# Patient Record
Sex: Male | Born: 1990 | Race: Black or African American | Hispanic: No | Marital: Single | Smoking: Former smoker
Health system: Southern US, Community
[De-identification: ages and names within clinical notes are randomized; demographics above are authoritative.]

## PROBLEM LIST (undated history)

## (undated) DIAGNOSIS — R519 Headache, unspecified: Secondary | ICD-10-CM

## (undated) DIAGNOSIS — R55 Syncope and collapse: Secondary | ICD-10-CM

## (undated) DIAGNOSIS — M25519 Pain in unspecified shoulder: Secondary | ICD-10-CM

## (undated) HISTORY — PX: KNEE ARTHROSCOPY WITH ANTERIOR CRUCIATE LIGAMENT (ACL) REPAIR: SHX5644

## (undated) HISTORY — DX: Headache, unspecified: R51.9

## (undated) HISTORY — DX: Pain in unspecified shoulder: M25.519

## (undated) HISTORY — DX: Syncope and collapse: R55

---
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
## 2008-04-13 ENCOUNTER — Emergency Department (HOSPITAL_COMMUNITY): Admission: EM | Admit: 2008-04-13 | Discharge: 2008-04-14 | Payer: Self-pay | Admitting: Emergency Medicine

## 2013-08-23 ENCOUNTER — Ambulatory Visit (INDEPENDENT_AMBULATORY_CARE_PROVIDER_SITE_OTHER): Payer: BC Managed Care – PPO | Admitting: Physician Assistant

## 2013-08-23 VITALS — BP 118/68 | HR 64 | Temp 98.0°F | Resp 16 | Ht 71.5 in | Wt 159.9 lb

## 2013-08-23 DIAGNOSIS — Z23 Encounter for immunization: Secondary | ICD-10-CM

## 2013-08-23 NOTE — Progress Notes (Signed)
   Subjective:    Patient ID: James Maddox, male    DOB: 1990/03/07, 23 y.o.   MRN: [id number]   PCP: No PCP Per Patient  Chief Complaint  Patient presents with  . Immunizations    t dap   Medications, allergies, past medical history, surgical history, family history, social history and problem list reviewed and updated.   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  Review of Systems     Objective:   Physical Exam  BP 118/68  Pulse 64  Temp(Src) 98 F (36.7 C) (Oral)  Resp 16  Ht 5' 11.5" (1.816 m)  Wt 159 lb 14.4 oz (72.53 kg)  BMI 21.99 kg/m2  SpO2 100% WDWNBM, A&O x 3.      Assessment & Plan:  1. Need for Tdap vaccination - Tdap vaccine greater than or equal to 7yo IM   Fernande Brashelle S. Christine Morton, PA-C Physician Assistant-Certified [facility]

## 2014-03-03 ENCOUNTER — Emergency Department (HOSPITAL_COMMUNITY)
Admission: EM | Admit: 2014-03-03 | Discharge: 2014-03-03 | Disposition: A | Discharge status: Home / Self Care | Payer: Self-pay | Attending: Emergency Medicine | Admitting: Emergency Medicine

## 2014-03-03 ENCOUNTER — Encounter (HOSPITAL_COMMUNITY): Payer: Self-pay | Admitting: Physical Medicine and Rehabilitation

## 2014-03-03 DIAGNOSIS — R519 Headache, unspecified: Secondary | ICD-10-CM

## 2014-03-03 DIAGNOSIS — R51 Headache: Secondary | ICD-10-CM | POA: Insufficient documentation

## 2014-03-03 DIAGNOSIS — R197 Diarrhea, unspecified: Secondary | ICD-10-CM | POA: Insufficient documentation

## 2014-03-03 DIAGNOSIS — R112 Nausea with vomiting, unspecified: Secondary | ICD-10-CM | POA: Insufficient documentation

## 2014-03-03 DIAGNOSIS — J029 Acute pharyngitis, unspecified: Secondary | ICD-10-CM | POA: Insufficient documentation

## 2014-03-03 MED ORDER — ONDANSETRON 4 MG PO TBDP: 4.0000 mg | ORAL_TABLET | Freq: Three times a day (TID) | ORAL | Status: AC | PRN

## 2014-03-03 MED ORDER — PSEUDOEPHEDRINE HCL 30 MG PO TABS: 30.0000 mg | ORAL_TABLET | ORAL | Status: AC | PRN

## 2014-03-03 MED ORDER — METOCLOPRAMIDE HCL 5 MG/ML IJ SOLN
10.0000 mg | Freq: Once | INTRAMUSCULAR | Status: AC
  Administered 2014-03-03: 10 mg via INTRAMUSCULAR
  Filled 2014-03-03: qty 2

## 2014-03-03 NOTE — ED Notes (Signed)
Pt presents to department for evaluation of headache, nausea/vomiting and blurred vision. Ongoing x3 days. No relief with medications at home. Pt is alert and oriented x4. No neurological deficits noted.

## 2014-03-03 NOTE — ED Notes (Signed)
Pt A&OX4, ambulatory at d/c with steady gait, NAD 

## 2014-03-03 NOTE — ED Provider Notes (Signed)
Patient presented to the ER with headache, nausea, vomiting, diarrhea.  Face to face Exam: HEENT - PERRLA Lungs - CTAB Heart - RRR, no M/R/G Abd - S/NT/ND Neuro - alert, oriented x3  Plan: Patient with benign exam. Vital signs stable. Symptoms most consistent with viral syndrome. Examination does not support concern or diagnosis of meningitis. Treat symptomatically, return if needed.   Gilda Creasehristopher J. Tyqwan Pink, MD 03/03/14 2012

## 2014-03-03 NOTE — ED Provider Notes (Signed)
CSN: [id number]     Arrival date & time 03/03/14  1814 History   First MD Initiated Contact with Patient 03/03/14 1917     Chief Complaint  Patient presents with  . Headache  . Emesis     (Consider location/radiation/quality/duration/timing/severity/associated sxs/prior Treatment) Patient is a 24 y.o. male presenting with vomiting. The history is provided by the patient. No language interpreter was used.  Emesis Severity:  Moderate Duration:  1 day Timing:  Intermittent Able to tolerate:  Liquids Chronicity:  New Recent urination:  Normal Relieved by:  Nothing Worsened by:  Nothing tried Ineffective treatments:  None tried Associated symptoms: chills, headaches and sore throat   Associated symptoms: no abdominal pain, no cough, no diarrhea and no fever   Headaches:    Severity:  Moderate   Onset quality:  Gradual   Duration:  3 days   Timing:  Constant   Progression:  Worsening   Chronicity:  New Sore throat:    Severity:  Moderate   Onset quality:  Gradual   Duration:  1 day   Timing:  Constant   Progression:  Unchanged Risk factors: no alcohol use, no diabetes and no sick contacts     History reviewed. No pertinent past medical history. Past Surgical History  Procedure Laterality Date  . Knee arthroscopy with anterior cruciate ligament (acl) repair Right    History reviewed. No pertinent family history. History  Substance Use Topics  . Smoking status: Never Smoker   . Smokeless tobacco: Never Used  . Alcohol Use: No    Review of Systems  Constitutional: Positive for chills.  HENT: Positive for sore throat.   Respiratory: Negative for cough and shortness of breath.   Gastrointestinal: Positive for vomiting. Negative for abdominal pain and diarrhea.  Genitourinary: Negative for dysuria, urgency and frequency.  Neurological: Positive for headaches.  All other systems reviewed and are negative.     Allergies  Review of patient's allergies indicates no  known allergies.  Home Medications   Prior to Admission medications   Medication Sig Start Date End Date Taking? Authorizing Provider  ondansetron (ZOFRAN ODT) 4 MG disintegrating tablet Take 1 tablet (4 mg total) by mouth every 8 (eight) hours as needed for nausea or vomiting. 03/03/14   Bethann BerkshireAaron Elica Almas, MD  pseudoephedrine (SUDAFED) 30 MG tablet Take 1 tablet (30 mg total) by mouth every 4 (four) hours as needed for congestion. 03/03/14   Bethann BerkshireAaron Tyffany Waldrop, MD   BP 113/69 mmHg  Pulse 92  Temp(Src) 99.9 F (37.7 C) (Oral)  Resp 20  SpO2 100% Physical Exam  Constitutional: He is oriented to person, place, and time. He appears well-developed and well-nourished. No distress.  HENT:  Head: Normocephalic and atraumatic.  Mouth/Throat: No oropharyngeal exudate, posterior oropharyngeal edema, posterior oropharyngeal erythema or tonsillar abscesses.  Eyes: Pupils are equal, round, and reactive to light.  Neck: Normal range of motion.  Cardiovascular: Normal rate, regular rhythm and normal heart sounds.   Pulmonary/Chest: Effort normal and breath sounds normal. No respiratory distress. He has no decreased breath sounds. He has no wheezes. He has no rhonchi. He has no rales.  Abdominal: Soft. He exhibits no distension. There is no tenderness. There is no rebound and no guarding.  Musculoskeletal: He exhibits no edema or tenderness.  Neurological: He is alert and oriented to person, place, and time. No cranial nerve deficit or sensory deficit. He exhibits normal muscle tone. Coordination and gait normal.  Strength 5/5 bilateral upper and lower extremities.  Sensation intact x4 extremities.  CN II-XII intact.    Skin: Skin is warm and dry.  Nursing note and vitals reviewed.   ED Course  Procedures (including critical care time) Labs Review Labs Reviewed - No data to display  Imaging Review No results found.   EKG Interpretation None      MDM   Final diagnoses:  Nonintractable headache,  unspecified chronicity pattern, unspecified headache type  Non-intractable vomiting with nausea, vomiting of unspecified type  Diarrhea   Patient is a 24 year old African-American male with no pertinent past medical history who comes to the emergency department today with headache, myalgias, nausea, vomiting, diarrhea, and sore throat for the past 3 days. Physical exam as above. Patient has no medical problems. He has a benign abdominal exam as a result doubt appendicitis, acute cholecystitis, or pancreatitis.  Patient has no focal neurologic deficits as a s result I doubt a CVA. He has no neck stiffness and no change in mental status as result I doubt a meningitis. With slow onset of headache I doubt a subarachnoid hemorrhage. Patient is likely suffering from a viral-type syndrome. He was treated with Reglan for his headache and nausea with moderate improvement. He was provided with a prescription for Zofran for his nausea and vomiting and with Sudafed for his nasal congestion. Patient was instructed to return to the emergency department with fevers, chills, worsening pain, or any other concerns. Patient does report some difficulty swallowing however states this is more secondary to pain than actual difficulty. He has no evidence of peritonsillar abscess and has good range of motion of his neck as a result doubt a retropharyngeal abscess.  Patient was discharged in good condition.  Care was discussed with my attending Dr. Blinda Leatherwood.      Bethann Berkshire, MD 03/04/14 0454  Gilda Crease, MD 03/06/14 [phone]

## 2014-03-03 NOTE — Discharge Instructions (Signed)
General Headache Without Cause °A general headache is pain or discomfort felt around the head or neck area. The cause may not be found.  °HOME CARE  °· Keep all doctor visits. °· Only take medicines as told by your doctor. °· Lie down in a dark, quiet room when you have a headache. °· Keep a journal to find out if certain things bring on headaches. For example, write down: °¨ What you eat and drink. °¨ How much sleep you get. °¨ Any change to your diet or medicines. °· Relax by getting a massage or doing other relaxing activities. °· Put ice or heat packs on the head and neck area as told by your doctor. °· Lessen stress. °· Sit up straight. Do not tighten (tense) your muscles. °· Quit smoking if you smoke. °· Lessen how much alcohol you drink. °· Lessen how much caffeine you drink, or stop drinking caffeine. °· Eat and sleep on a regular schedule. °· Get 7 to 9 hours of sleep, or as told by your doctor. °· Keep lights dim if bright lights bother you or make your headaches worse. °GET HELP RIGHT AWAY IF:  °· Your headache becomes really bad. °· You have a fever. °· You have a stiff neck. °· You have trouble seeing. °· Your muscles are weak, or you lose muscle control. °· You lose your balance or have trouble walking. °· You feel like you will pass out (faint), or you pass out. °· You have really bad symptoms that are different than your first symptoms. °· You have problems with the medicines given to you by your doctor. °· Your medicines do not work. °· Your headache feels different than the other headaches. °· You feel sick to your stomach (nauseous) or throw up (vomit). °MAKE SURE YOU:  °· Understand these instructions. °· Will watch your condition. °· Will get help right away if you are not doing well or get worse. °Document Released: 10/22/2007 Document Revised: 04/06/2011 Document Reviewed: 01/02/2011 °ExitCare® Patient Information ©2015 [employer]. This information is not intended to replace advice given to  you by your health care provider. Make sure you discuss any questions you have with your health care provider. ° °Emergency Department Resource Guide °1) Find a Doctor and Pay Out of Pocket °Although you won't have to find out who is covered by your insurance plan, it is a good idea to ask around and get recommendations. You will then need to call the office and see if the doctor you have chosen will accept you as a new patient and what types of options they offer for patients who are self-pay. Some doctors offer discounts or will set up payment plans for their patients who do not have insurance, but you will need to ask so you aren't surprised when you get to your appointment. ° °2) Contact Your Local Health Department °Not all health departments have doctors that can see patients for sick visits, but many do, so it is worth a call to see if yours does. If you don't know where your local health department is, you can check in your phone book. The CDC also has a tool to help you locate your state's health department, and many state websites also have listings of all of their local health departments. ° °3) Find a Walk-in Clinic °If your illness is not likely to be very severe or complicated, you may want to try a walk in clinic. These are popping up all over the   country in pharmacies, drugstores, and shopping centers. They're usually staffed by nurse practitioners or physician assistants that have been trained to treat common illnesses and complaints. They're usually fairly quick and inexpensive. However, if you have serious medical issues or chronic medical problems, these are probably not your best option. ° °No Primary Care Doctor: °- Call Health Connect at  832-8000 - they can help you locate a primary care doctor that  accepts your insurance, provides certain services, etc. °- Physician Referral Service- 1-800-533-3463 ° °Chronic Pain Problems: °Organization         Address  Phone   Notes  °[facility]  (336) 297-2271 Patients need to be referred by their primary care doctor.  ° °Medication Assistance: °Organization         Address  Phone   Notes  °[place] [facility] [address] °[place], [place] [address] (336) 641-8030 --Must be a resident of [place] °-- Must have NO insurance coverage whatsoever (no Medicaid/ Medicare, etc.) °-- The pt. MUST have a primary care doctor that directs their care regularly and follows them in the community °  °[facility]  (866) 331-1348   °[employer]  (888) 892-1162   ° °Agencies that provide inexpensive medical care: °Organization         Address  Phone   Notes  °[facility]  (336) 832-8035   °[facility]    (336) 832-7272   °[facility] [address] °[place], [place] [address] (336) 832-4777   °[facility] [address], °[place] (336) 271-4999   °[facility]    (336) 373-0678   °[facility]    (336) 272-1050   °[facility] ° [address], [place] Phone:  (336) 832-4444, Fax:  (336) 832-4440 Hours of Operation:  9 am - 6 pm, M-F.  Also accepts Medicaid/Medicare and self-pay.  °[facility] ° [address], [place] Phone: (336) 832-3150, Fax: (336) 832-3151. Hours of Operation:  8:30 am - 5:30 pm, M-F.  Also accepts Medicaid and self-pay.  °[facility] [place] [address], [place] Phone: (336) 878-6027   °[facility] [address], [place], [place] (336)723-1848, Ext. 123 Mondays & Thursdays: 7-9 AM.  First 15 patients are seen on a first come, first serve basis. °  ° °Medicaid-accepting [place] Providers: ° °Organization         Address  Phone   Notes  °[facility] [address], [place] (336) 641-2100 Also accepts self-pay patients.  °[facility] [address], [place] ° (336) 856-9996   °[facility] [address], [place] (336) 288-8857   °[facility] [address] [place] [address], [place] (336) 299-7000   °Veita Bland [address], [place]  ° (336) 373-1557 Only accepts [place] Access Medicaid patients after they have their name applied to their card.  ° °Self-Pay (no insurance) in [place]: ° °Organization         Address  Phone   Notes  °Sickle Cell Patients, [facility] [address], [place] (336) 832-1970   °[facility] [address], [place] (336) 832-4400   °[facility] [place] ° [address]   [address], [place] (336) 992-4800   °[facility]/Dr. Osei-Bonsu ° [address] [place] [address], [place] or [address], [place] (336) 841-8500 Phone number for both [place] and [place] locations is the same.  °[facility] [address], [place] (336) 299-0000   °[facility] [address] [place] [address], [place] or [address] (336) 852-7530 °(336) 878-2260   °[facility] [address], [place] (336) 350-1642, phone; (336) 294-5005, fax Sees patients 1st and 3rd Saturday of every month.  Must not qualify for public or private insurance (i.e. Medicaid, Medicare, [place] Health Choice, Veterans' Benefits) • Household income should be no more than 200% of the poverty level •The clinic cannot treat you if you are pregnant or think you are pregnant • Sexually transmitted diseases are not treated at the clinic.  ° ° °Dental Care: °Organization         Address  Phone  Notes  °[facility] [facility] [address], [place] (336) 641-6152 Accepts children up to age 21 who are enrolled in Medicaid or [place] Health Choice; pregnant women with a Medicaid card; and children who have applied for Medicaid or [employer]  Health Choice, but were declined, whose parents can pay a reduced fee at time of service.  °[facility] [place]  [address], [place] (336) 641-7733 Accepts children up to age 21 who are enrolled in Medicaid or [place] Health Choice; pregnant women with a Medicaid card; and children who have applied for Medicaid or [place] Health Choice, but were declined, whose parents can pay a reduced fee at time of service.  °[facility] ° [address], [place] (336) 641-4533 Patients are seen by appointment only. Walk-ins are not accepted. [facility] will see patients 18 years of age and older. °Monday - Tuesday (8am-5pm) °Most Wednesdays (8:30-5pm) °$30 per visit, cash only  °[facility] ° [address], [place] (336) 641-4533 Patients are seen by appointment only. Walk-ins are not accepted. [facility] will see patients 18 years of age and older. °One Wednesday Evening (Monthly: Volunteer Based).  $30 per visit, cash only  °[facility]  (919) 537-3737 for adults; Children under age 4, call Graduate Pediatric Dentistry at (919) [phone]. Children aged 4-14, please call (919) [phone] to request a pediatric application. ° Dental services are provided in all areas of dental care including fillings, crowns and bridges, complete and partial dentures, implants, gum treatment, root canals, and extractions. Preventive care is also provided. Treatment is provided to both adults and children. °Patients are selected via a lottery and there is often a waiting list. °  °[facility] [address], °[place] ° (336) 763-8833 [email or web address] °  °[facility] [address], [place], [place] (336)723-1848, Ext. 123 Second and Fourth Thursday of each month, opens at 6:30 AM; Clinic ends at 9 AM.  Patients are seen on a first-come first-served basis, and a limited number are seen during  each clinic.  ° °[facility] ° [address], [place],  (336) 723-7904   Eligibility Requirements °You must have lived in [place], [place], or [place] counties for at least the last three months. °  You cannot be eligible for state or federal sponsored healthcare insurance, including Veterans Administration, Medicaid, or Medicare. °  You   generally cannot be eligible for healthcare insurance through your employer.  °  How to apply: °Eligibility screenings are held every Tuesday and Wednesday afternoon from 1:00 pm until 4:00 pm. You do not need an appointment for the interview!  °[facility] [address], [place], [place] 336-631-2330   °[facility]  336-342-8273   °[facility]  336-703-3100   °[facility]  336-570-6415   ° °Behavioral Health Resources in the Community: °Intensive Outpatient Programs °Organization         Address  Phone  Notes  °[facility] [address], [place], [place] 336-878-6098   °[facility] [address], [place], [place] 336-832-9800   °[facility] [address], [place], [place] ° 336-882-2125   °[facility] [address],  °[place], [place] 1-800-853-5163 or 336-641-4981   °Substance Abuse Resources °Organization         Address  Phone  Notes  °[facility]  336-882-2125   °[facility]  336-784-9470   °[facility]  336-285-9073   °[facility]  336-845-3988   °Residential & Outpatient Substance Abuse Program  1-800-659-3381   °Psychological Services °Organization         Address  Phone  Notes  °[facility]  336- 832-9600   °[facility]  336- 378-7881   °[facility] [address], [place] 1-800-853-5163 or 336-641-4981   ° °Mobile Crisis Teams °Organization         Address  Phone  Notes  °[facility], Mobile  Crisis Care Unit  1-877-626-1772   °Assertive °[facility] ° [address] [place], [place] 336-834-9664   °Sharon DeEsch [address] °[place] 336-554-5454   ° °Self-Help/Support Groups °Organization         Address  Phone             Notes  °[facility] - variety of support groups  336- 373-1402 Call for more information  °Narcotics Anonymous (NA), [facility] [address], °[place]  2 meetings at this location  ° °Residential Treatment Programs °Organization         Address  Phone  Notes  °[facility] [address],    °[place]  1-866-801-8205   °[facility] ° [address], [place], [place] 704-293-8524   °[facility] [address], [place] 336-845-3988 Admissions: 8am-3pm M-F  °[facility] [address]    °[place], [place] 336-841-1104   °[facility] [address], [place], [place] 336-379-7146   °[facility] [address]  °[place], [place] 336-285-9073   °[facility] - Intensive Outpatient [address], [place], [place] 336-852-3033   °[facility] [address]  °[place], [place] 1-877-615-2722 or 336-784-9470   °[facility] [address] [place], [place] 336-227-7417 Accepts Medicaid  °[facility] [address]  °[place]  1-800-659-3381 Substance Abuse/Addiction Treatment  ° °[place] Behavioral Health Resources °Organization         Address  Phone  Notes  °[facility]  (888) 581-9988   °Julie Brannon, PhD [address] [place],    (336) 349-5553 or (336) 951-0000   °[facility]   [address] °[place], [place] (336) 349-4454   °[facility] [address], [place], [place] (336) 342-8316 Insurance/Medicaid/sponsorship through [facility]  °[facility] [address]                                    [place], [place] (336) 342-8316 Therapy/tele-psych/case  °[facility] [address]  ° [place], [place] (336) 349-2233    °Dr. Arfeen  (336) 349-4544   °[facility] [place]  [employer] [place] [facility] 1) [address],  °2) [address], [place] °3)  [address], [place] (336) 349-3220 °(336) 342-7768 ° °(336) 342-8140   °[place] Child Abuse [facility] (336) 342-1394 or (336) 342-3537 (After Hours)    ° ° °

## 2020-06-27 ENCOUNTER — Other Ambulatory Visit: Payer: Self-pay | Admitting: Family Medicine

## 2020-06-27 ENCOUNTER — Other Ambulatory Visit: Payer: Self-pay

## 2020-06-27 ENCOUNTER — Ambulatory Visit: Payer: Self-pay

## 2020-06-27 DIAGNOSIS — M25561 Pain in right knee: Secondary | ICD-10-CM

## 2022-02-28 ENCOUNTER — Emergency Department (HOSPITAL_COMMUNITY)
Admission: EM | Admit: 2022-02-28 | Discharge: 2022-02-28 | Disposition: A | Discharge status: Home / Self Care | Payer: BC Managed Care – PPO | Attending: Emergency Medicine | Admitting: Emergency Medicine

## 2022-02-28 ENCOUNTER — Other Ambulatory Visit: Payer: Self-pay

## 2022-02-28 ENCOUNTER — Encounter (HOSPITAL_COMMUNITY): Payer: Self-pay | Admitting: Emergency Medicine

## 2022-02-28 DIAGNOSIS — R55 Syncope and collapse: Secondary | ICD-10-CM | POA: Diagnosis present

## 2022-02-28 DIAGNOSIS — R519 Headache, unspecified: Secondary | ICD-10-CM | POA: Diagnosis not present

## 2022-02-28 DIAGNOSIS — M25512 Pain in left shoulder: Secondary | ICD-10-CM | POA: Diagnosis not present

## 2022-02-28 LAB — CBC
HCT: 41.2 % (ref 39.0–52.0)
Hemoglobin: 13.9 g/dL (ref 13.0–17.0)
MCH: 32.9 pg (ref 26.0–34.0)
MCHC: 33.7 g/dL (ref 30.0–36.0)
MCV: 97.4 fL (ref 80.0–100.0)
Platelets: 152 10*3/uL (ref 150–400)
RBC: 4.23 MIL/uL (ref 4.22–5.81)
RDW: 12.7 % (ref 11.5–15.5)
WBC: 5.3 10*3/uL (ref 4.0–10.5)
nRBC: 0 % (ref 0.0–0.2)

## 2022-02-28 LAB — BASIC METABOLIC PANEL
Anion gap: 10 (ref 5–15)
BUN: 12 mg/dL (ref 6–20)
CO2: 25 mmol/L (ref 22–32)
Calcium: 9.3 mg/dL (ref 8.9–10.3)
Chloride: 100 mmol/L (ref 98–111)
Creatinine, Ser: 1.07 mg/dL (ref 0.61–1.24)
GFR, Estimated: >60 mL/min (ref >60)
Glucose, Bld: 121 mg/dL — ABNORMAL HIGH (ref 70–99)
Potassium: 4.2 mmol/L (ref 3.5–5.1)
Sodium: 135 mmol/L (ref 135–145)

## 2022-02-28 LAB — CBG MONITORING, ED: Glucose-Capillary: 86 mg/dL (ref 70–99)

## 2022-02-28 MED ORDER — SODIUM CHLORIDE 0.9 % IV BOLUS
1000.0000 mL | Freq: Once | INTRAVENOUS | Status: AC
  Administered 2022-02-28: 1000 mL via INTRAVENOUS

## 2022-02-28 NOTE — ED Triage Notes (Signed)
Pt reports he was cooking today and became lightheaded and passed out. Pt stating he hit his left shoulder and left side of his head. Denies neck pain.

## 2022-02-28 NOTE — Discharge Instructions (Addendum)
You were evaluated today for a syncopal episode.  This may be due to a vasovagal response or may be due to dehydration.  Your EKG was unremarkable.  Your lab work was reassuring.  If you experience further syncopal episodes or other life-threatening symptoms please return to the emergency department. If you continue to have pain in the left shoulder I recommend follow-up with orthopedics.  I have provided the contact information from the on-call provider.  The sling you have been placed in is for comfort.  He can find it cumbersome or not helpful you may stop using it.

## 2022-02-28 NOTE — ED Provider Notes (Signed)
[facility] Provider Note   CSN: [id number] Arrival date & time: 02/28/22  1630     History  Chief Complaint  Patient presents with   Loss of Consciousness    James Maddox is a 32 y.o. male.  Patient presents to the emergency department after a syncopal episode.  Patient states he was standing and cooking when he began to feel hot, got lightheaded, and fell to the floor landing on his left shoulder and possibly hitting his head on the ground.  He does endorse a mild headache earlier in the day.  He denies nausea, vomiting, amnesia.  He denies current headache and denies neck pain.  He does state that he may not have been drinking much water over the past few days.  No relevant past medical history on file.  HPI     Home Medications Prior to Admission medications   Medication Sig Start Date End Date Taking? Authorizing Provider  ondansetron (ZOFRAN ODT) 4 MG disintegrating tablet Take 1 tablet (4 mg total) by mouth every 8 (eight) hours as needed for nausea or vomiting. 03/03/14   Katheren Shams, MD  pseudoephedrine (SUDAFED) 30 MG tablet Take 1 tablet (30 mg total) by mouth every 4 (four) hours as needed for congestion. 03/03/14   Katheren Shams, MD      Allergies    Patient has no known allergies.    Review of Systems   Review of Systems  Musculoskeletal:  Positive for arthralgias.  Neurological:  Positive for syncope and headaches.    Physical Exam Updated Vital Signs BP 136/84   Pulse 87   Temp 98.4 F (36.9 C) (Oral)   Resp 16   SpO2 100%  Physical Exam Vitals and nursing note reviewed.  Constitutional:      General: He is not in acute distress.    Appearance: He is well-developed.  HENT:     Head: Normocephalic and atraumatic.  Eyes:     Conjunctiva/sclera: Conjunctivae normal.  Cardiovascular:     Rate and Rhythm: Normal rate and regular rhythm.     Heart sounds: No murmur heard. Pulmonary:     Effort:  Pulmonary effort is normal. No respiratory distress.     Breath sounds: Normal breath sounds.  Abdominal:     Palpations: Abdomen is soft.     Tenderness: There is no abdominal tenderness.  Musculoskeletal:        General: No swelling, tenderness or deformity. Normal range of motion.     Cervical back: Neck supple.     Comments: Patient with normal range of motion of the left shoulder.  Normal sensation.  Brisk cap refill  Skin:    General: Skin is warm and dry.     Capillary Refill: Capillary refill takes less than 2 seconds.  Neurological:     General: No focal deficit present.     Mental Status: He is alert.  Psychiatric:        Mood and Affect: Mood normal.     ED Results / Procedures / Treatments   Labs (all labs ordered are listed, but only abnormal results are displayed) Labs Reviewed  BASIC METABOLIC PANEL - Abnormal; Notable for the following components:      Result Value   Glucose, Bld 121 (*)    All other components within normal limits  CBC  CBG MONITORING, ED    EKG None  Radiology No results found.  Procedures Procedures  Medications Ordered in ED Medications  sodium chloride 0.9 % bolus 1,000 mL (0 mLs Intravenous Stopped 02/28/22 1958)    ED Course/ Medical Decision Making/ A&P                             Medical Decision Making Amount and/or Complexity of Data Reviewed Labs: ordered.   This patient presents to the ED for concern of syncope, this involves an extensive number of treatment options, and is a complaint that carries with it a high risk of complications and morbidity.  The differential diagnosis includes vasovagal syncope, orthostatic changes due to dehydration, dysrhythmia, CVA, others   Co morbidities that complicate the patient evaluation  None   Additional history obtained:  Additional history obtained from visitor at bedside    Lab Tests:  I Ordered, and personally interpreted labs.  The pertinent results include:  Grossly unremarkable CBC, BMP    Cardiac Monitoring: / EKG:  The patient was maintained on a cardiac monitor.  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  EKG was unremarkable.  No dysrhythmia.  No neurodeficits to suggest CVA.  Patient does not clinically appear dehydrated.  He was administered a liter of fluid.  Negative orthostatic changes.  The patient does have some left shoulder pain.  The patient has normal range of motion but does complain of pain.  No deformity noted.  I see no indication at this time for imaging.  Patient was placed in a sling.  I will provide orthopedic follow-up information to be used as needed.  Plan to discharge patient home at this time with recommendations for follow-up as needed.  Return precautions provided        Final Clinical Impression(s) / ED Diagnoses Final diagnoses:  Syncope and collapse  Acute pain of left shoulder    Rx / DC Orders ED Discharge Orders     None         Ronny Bacon 02/28/22 Patrecia Pour    Regan Lemming, MD 03/02/22 1511

## 2022-02-28 NOTE — ED Notes (Signed)
Pt given sandwich, applesauce and water.

## 2022-03-02 IMAGING — DX DG KNEE COMPLETE 4+V*R*
4 series · 4 of 4 positions shown · non-contrast
Comparison: None.

CLINICAL DATA: Pain following twisting injury

EXAM:
RIGHT KNEE - COMPLETE 4+ VIEW

[knee pa]
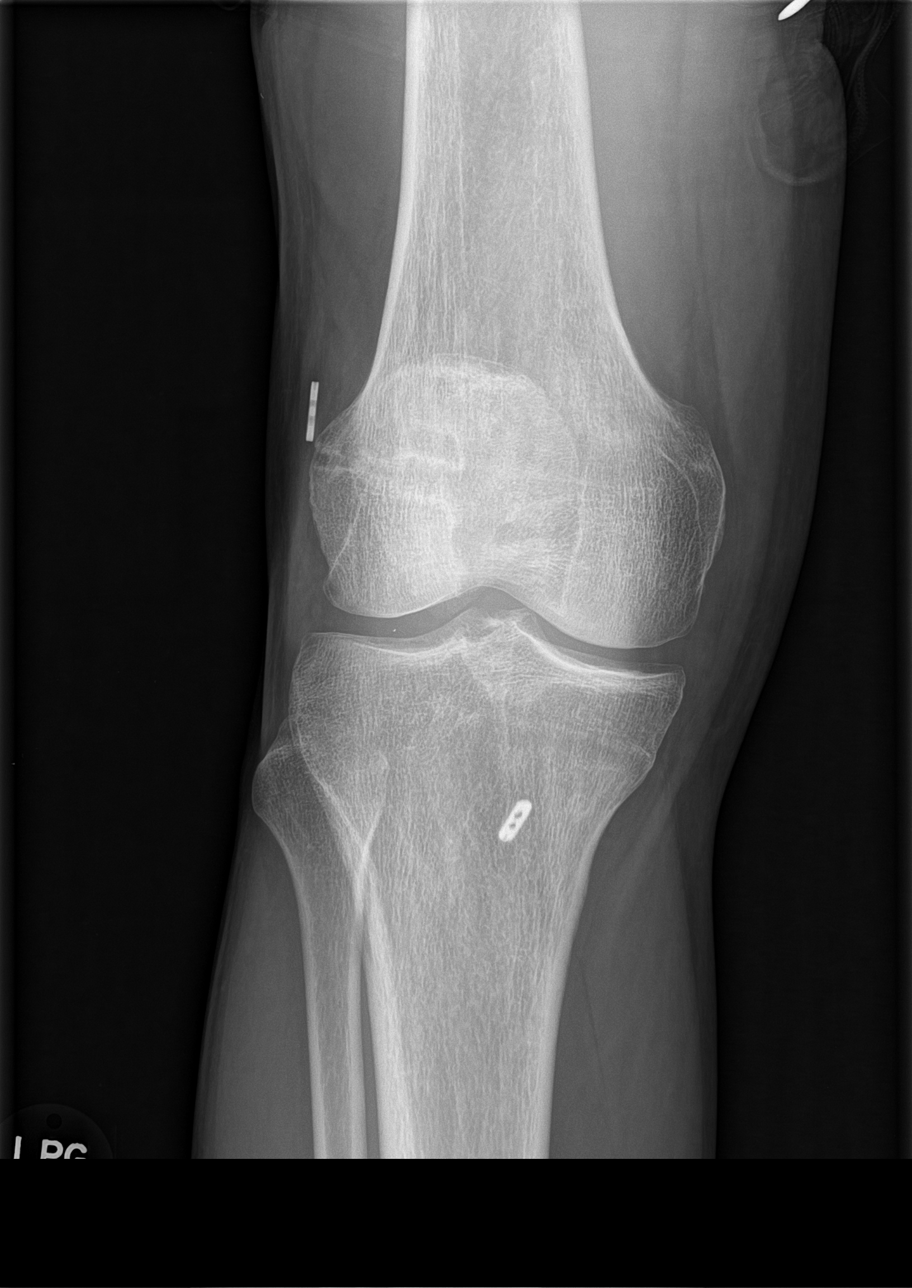

[knee obl (1 of 2)]
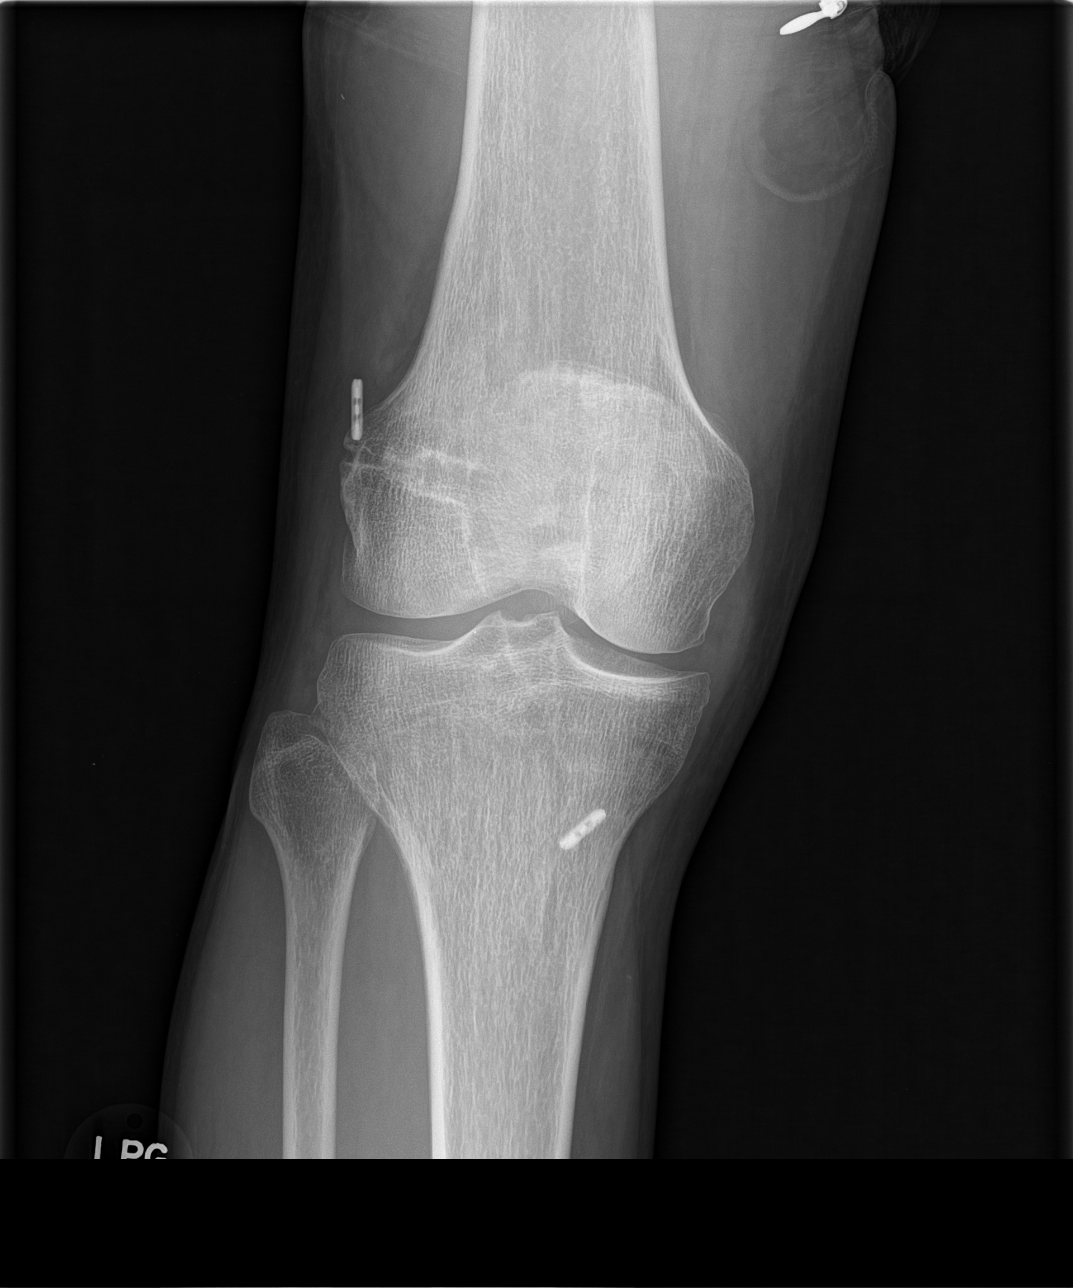

[knee obl (2 of 2)]
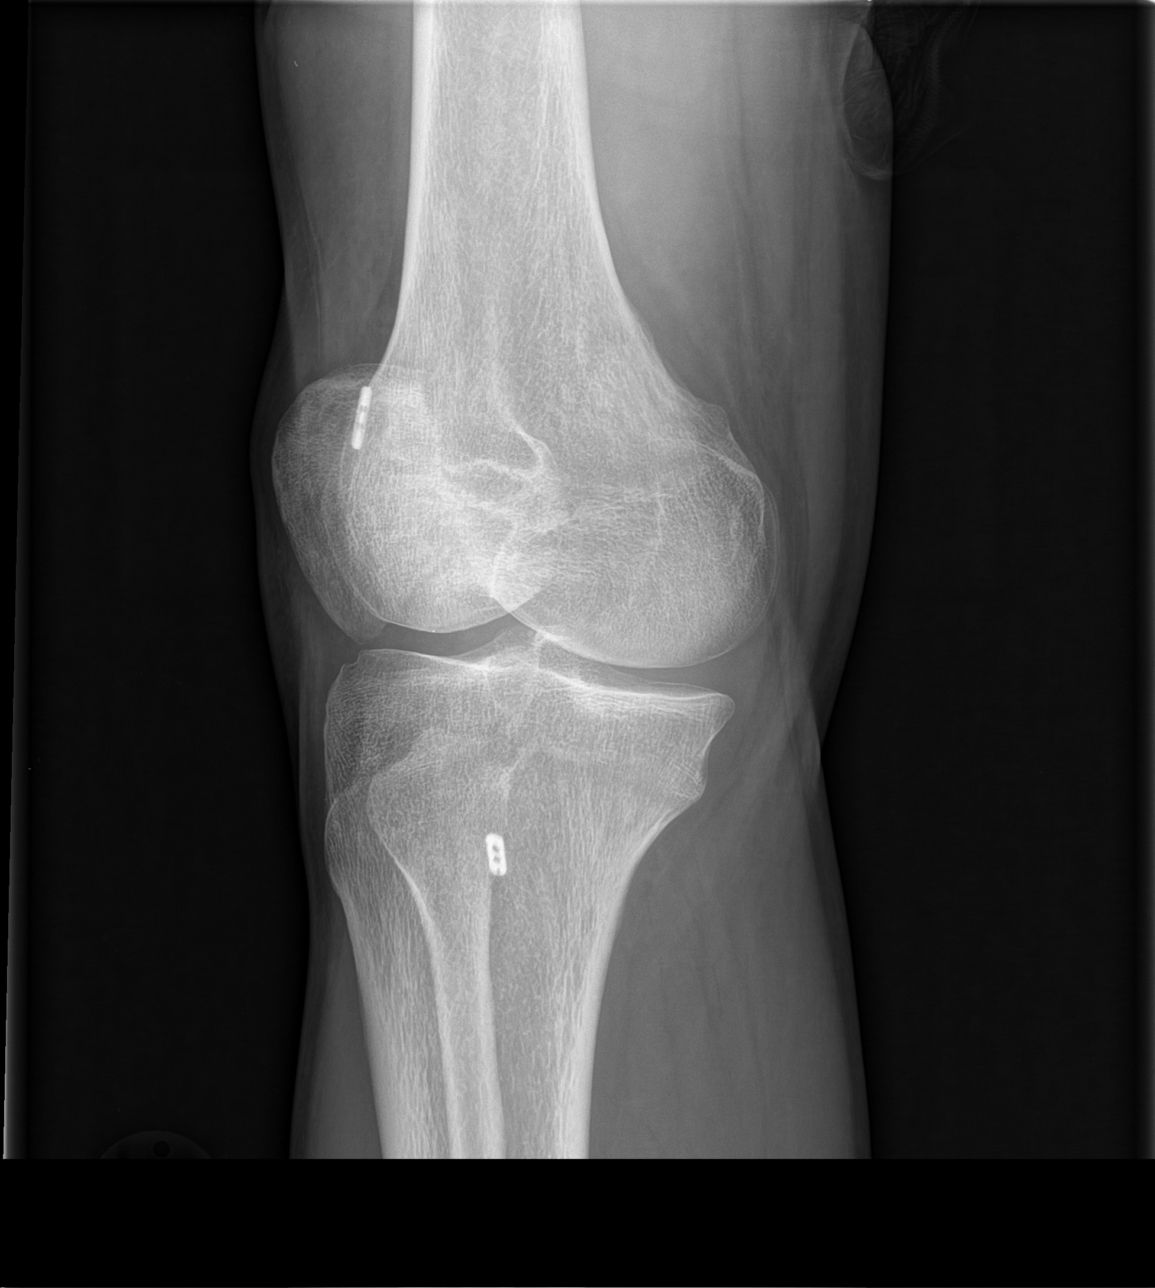

[knee lat]
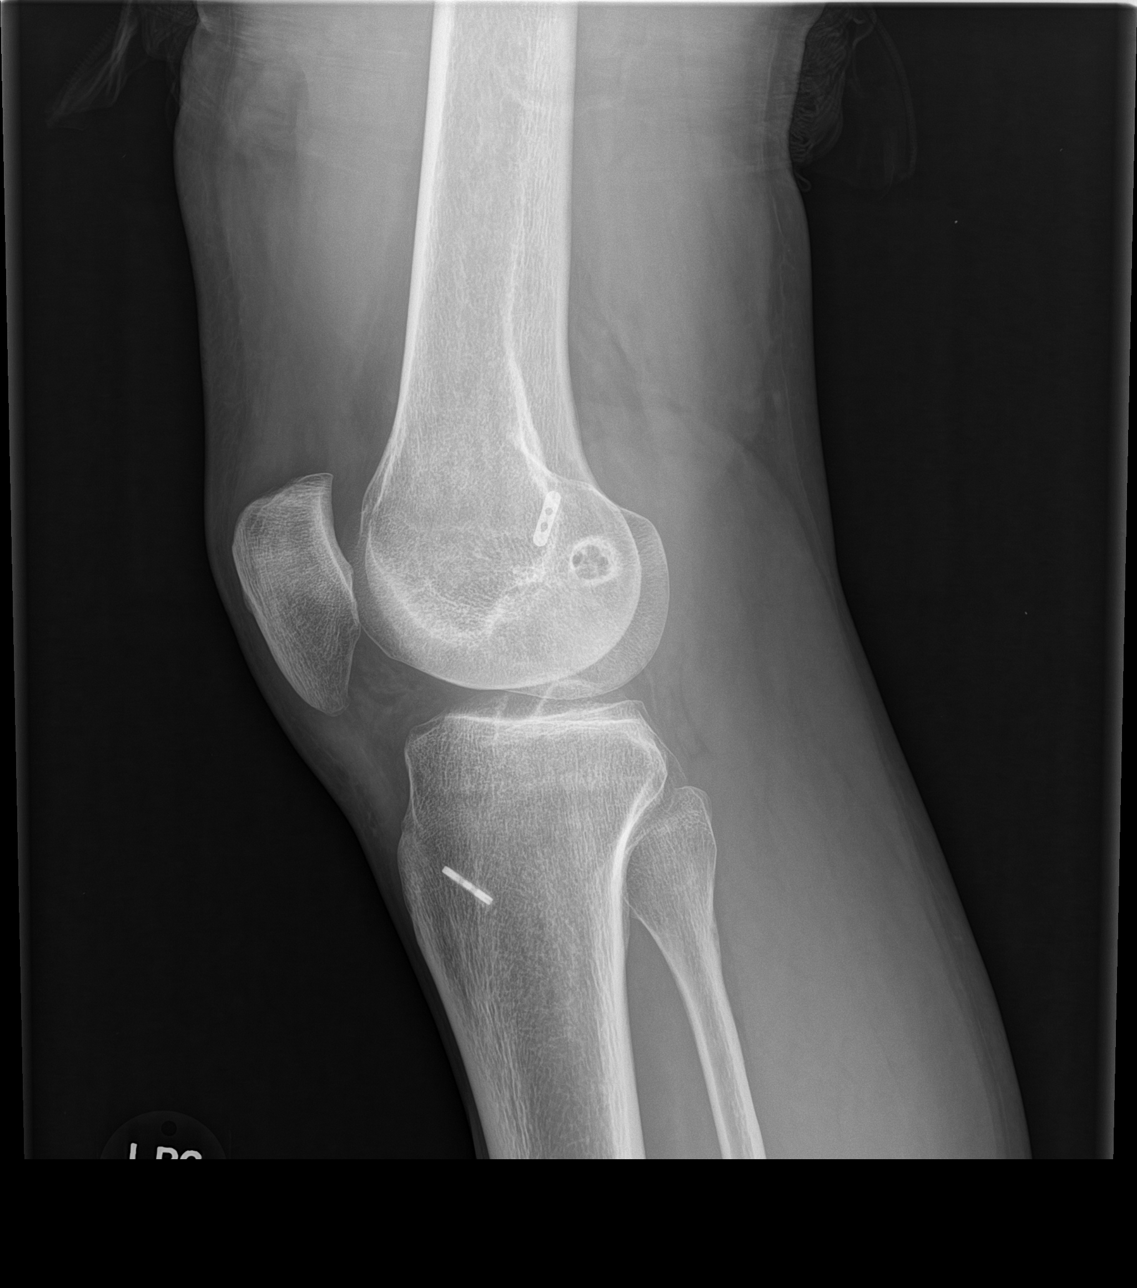

[4 of 4 positions shown; findings below may reference images not displayed]

FINDINGS: Frontal, lateral, and bilateral oblique views were obtained.
Evidence of previous anterior cruciate ligament repair. There is
slight lateral patellar subluxation. No fracture or dislocation. No
appreciable joint space narrowing. No joint effusion. No erosive
change.
IMPRESSION: Status post anterior cruciate ligament repair. Mild lateral patellar
subluxation. No fracture, dislocation, or joint effusion. No evident
arthropathic change.

## 2022-03-03 ENCOUNTER — Other Ambulatory Visit: Payer: Self-pay

## 2022-03-03 ENCOUNTER — Emergency Department (HOSPITAL_COMMUNITY): Payer: BC Managed Care – PPO

## 2022-03-03 ENCOUNTER — Encounter (HOSPITAL_COMMUNITY): Payer: Self-pay | Admitting: Emergency Medicine

## 2022-03-03 ENCOUNTER — Emergency Department (HOSPITAL_COMMUNITY)
Admission: EM | Admit: 2022-03-03 | Discharge: 2022-03-04 | Disposition: A | Discharge status: Home / Self Care | Payer: BC Managed Care – PPO | Attending: Emergency Medicine | Admitting: Emergency Medicine

## 2022-03-03 DIAGNOSIS — W01198A Fall on same level from slipping, tripping and stumbling with subsequent striking against other object, initial encounter: Secondary | ICD-10-CM | POA: Insufficient documentation

## 2022-03-03 DIAGNOSIS — R55 Syncope and collapse: Secondary | ICD-10-CM

## 2022-03-03 DIAGNOSIS — Y92000 Kitchen of unspecified non-institutional (private) residence as  the place of occurrence of the external cause: Secondary | ICD-10-CM | POA: Insufficient documentation

## 2022-03-03 DIAGNOSIS — Y990 Civilian activity done for income or pay: Secondary | ICD-10-CM | POA: Insufficient documentation

## 2022-03-03 DIAGNOSIS — Z1152 Encounter for screening for COVID-19: Secondary | ICD-10-CM | POA: Diagnosis not present

## 2022-03-03 DIAGNOSIS — S0990XD Unspecified injury of head, subsequent encounter: Secondary | ICD-10-CM | POA: Diagnosis present

## 2022-03-03 DIAGNOSIS — S060X1D Concussion with loss of consciousness of 30 minutes or less, subsequent encounter: Secondary | ICD-10-CM | POA: Diagnosis not present

## 2022-03-03 LAB — COMPREHENSIVE METABOLIC PANEL WITH GFR
ALT: 28 U/L (ref 0–44)
AST: 23 U/L (ref 15–41)
Albumin: 4 g/dL (ref 3.5–5.0)
Alkaline Phosphatase: 48 U/L (ref 38–126)
Anion gap: 6 (ref 5–15)
BUN: 15 mg/dL (ref 6–20)
CO2: 28 mmol/L (ref 22–32)
Calcium: 8.8 mg/dL — ABNORMAL LOW (ref 8.9–10.3)
Chloride: 101 mmol/L (ref 98–111)
Creatinine, Ser: 1.17 mg/dL (ref 0.61–1.24)
GFR, Estimated: >60 mL/min
Glucose, Bld: 111 mg/dL — ABNORMAL HIGH (ref 70–99)
Potassium: 3.5 mmol/L (ref 3.5–5.1)
Sodium: 135 mmol/L (ref 135–145)
Total Bilirubin: 0.4 mg/dL (ref 0.3–1.2)
Total Protein: 7.6 g/dL (ref 6.5–8.1)

## 2022-03-03 LAB — CBC
HCT: 41 % (ref 39.0–52.0)
Hemoglobin: 13.4 g/dL (ref 13.0–17.0)
MCH: 32.1 pg (ref 26.0–34.0)
MCHC: 32.7 g/dL (ref 30.0–36.0)
MCV: 98.1 fL (ref 80.0–100.0)
Platelets: 172 10*3/uL (ref 150–400)
RBC: 4.18 MIL/uL — ABNORMAL LOW (ref 4.22–5.81)
RDW: 12.7 % (ref 11.5–15.5)
WBC: 5.3 10*3/uL (ref 4.0–10.5)
nRBC: 0 % (ref 0.0–0.2)

## 2022-03-03 LAB — RESP PANEL BY RT-PCR (RSV, FLU A&B, COVID)  RVPGX2
Influenza A by PCR: NEGATIVE
Influenza B by PCR: NEGATIVE
Resp Syncytial Virus by PCR: NEGATIVE
SARS Coronavirus 2 by RT PCR: NEGATIVE

## 2022-03-03 LAB — URINALYSIS, ROUTINE W REFLEX MICROSCOPIC
Bilirubin Urine: NEGATIVE
Glucose, UA: NEGATIVE mg/dL
Hgb urine dipstick: NEGATIVE
Ketones, ur: NEGATIVE mg/dL
Leukocytes,Ua: NEGATIVE
Nitrite: NEGATIVE
Protein, ur: NEGATIVE mg/dL
Specific Gravity, Urine: 1.028 (ref 1.005–1.030)
pH: 6 (ref 5.0–8.0)

## 2022-03-03 LAB — TROPONIN I (HIGH SENSITIVITY): Troponin I (High Sensitivity): <2 ng/L (ref <18)

## 2022-03-03 NOTE — ED Triage Notes (Signed)
PT complains of feeling over heated, tired, feeling of passing out and chest pressure. Pt states he was here a few days ago after passing out and has feelings of the same today. Pt has headache and shoulder pain from previous injury after passing out.

## 2022-03-03 NOTE — ED Provider Triage Note (Signed)
Emergency Medicine Provider Triage Evaluation Note  James Maddox , a 32 y.o. male  was evaluated in triage.  Pt complains of near syncopal episode today.  Patient reports that he was here on Saturday for the same presentation.  However when he was at school, he felt he was in a pass out but did not pass out.  He has been having some fatigue as well as some left-sided head pain since he fell.  He reports that he hit his head when he fell and has been having pain since.  He is also having some photophobia as well.  He questions if he is having some chest pain or not.  Review of Systems  Positive:  Negative:   Physical Exam  Ht 5\' 10"  (1.778 m)   Wt 76 kg   BMI 24.04 kg/m  Gen:   Awake, no distress   Resp:  Normal effort  MSK:   Moves extremities without difficulty  Other:  Cranial nerves grossly intact.  No pronator drift.  Answers questions appropriate appropriate speech.  No midline tenderness to the cervical spine however does have pain with movement.  Medical Decision Making  Medically screening exam initiated at 5:47 PM.  Appropriate orders placed.  James Maddox was informed that the remainder of the evaluation will be completed by another provider, this initial triage assessment does not replace that evaluation, and the importance of remaining in the ED until their evaluation is complete.  Ct head and C spine ordered with labs.    James Maddox, [place] 03/03/22 1749

## 2022-03-03 NOTE — ED Provider Notes (Signed)
[facility] Provider Note   CSN: [id number] Arrival date & time: 03/03/22  1617     History {Add pertinent medical, surgical, social history, OB history to HPI:1} Chief Complaint  Patient presents with   Fatigue   Near Syncope    James Maddox is a 32 y.o. male.  31 yo male presents with complaint of feeling unwell today. Patient states he was seen here Saturday after a syncopal episode.  Patient states that he was having a usual day Saturday, was sitting when he started to feel lightheaded and sweaty, walked into the kitchen and while at the countertop, he passed out.  Patient states he hit the left side of his head, left shoulder on the way down, his roommate heard him fall.  Reports brief LOC, did not bite tongue or lose bladder control, no history of seizures.  Patient was seen in the ER at that time, told he may have a concussion, was placed in arm sling and discharged.  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  Denies palpitations or chest pain, feeling poorly prompted him to return to the ER for further evaluation.  States that he continues to have intermittent left-sided headaches, worse with computer use without nausea or vomiting.  Denies unilateral weakness or numbness, changes in speech or gait. States that he was having intermittent chest tightness back in October with exertion, was never evaluated for this at that time.  He denies any family heart history.  Denies ever experiencing palpitations.  Reports occasional marijuana use, occasional alcohol use, denies other illicit substances.  Denies consuming any vitamins or supplements.       Home Medications Prior to Admission medications   Medication Sig Start Date End Date Taking? Authorizing Provider  ondansetron (ZOFRAN ODT) 4 MG disintegrating tablet Take 1 tablet (4 mg total) by mouth every 8 (eight) hours  as needed for nausea or vomiting. 03/03/14   Katheren Shams, MD  pseudoephedrine (SUDAFED) 30 MG tablet Take 1 tablet (30 mg total) by mouth every 4 (four) hours as needed for congestion. 03/03/14   Katheren Shams, MD      Allergies    Patient has no known allergies.    Review of Systems   Review of Systems Negative except as per HPI Physical Exam Updated Vital Signs BP 116/78 (BP Location: Left Arm)   Pulse 60   Temp 98.4 F (36.9 C) (Oral)   Resp 16   Ht 5\' 10"  (1.778 m)   Wt 76 kg   SpO2 98%   BMI 24.04 kg/m  Physical Exam Vitals and nursing note reviewed.  Constitutional:      General: He is not in acute distress.    Appearance: He is well-developed. He is not diaphoretic.  HENT:     Head: Normocephalic and atraumatic.     Mouth/Throat:     Mouth: Mucous membranes are moist.  Eyes:     Extraocular Movements: Extraocular movements intact.     Pupils: Pupils are equal, round, and reactive to light.  Cardiovascular:     Rate and Rhythm: Normal rate and regular rhythm.     Heart sounds: Normal heart sounds.  Pulmonary:     Effort: Pulmonary effort is normal.     Breath sounds: Normal breath sounds.  Musculoskeletal:        General: Normal range of motion.  Cervical back: Normal range of motion and neck supple. No tenderness.  Skin:    General: Skin is warm and dry.     Findings: No erythema or rash.  Neurological:     Mental Status: He is alert and oriented to person, place, and time.  Psychiatric:        Behavior: Behavior normal.     ED Results / Procedures / Treatments   Labs (all labs ordered are listed, but only abnormal results are displayed) Labs Reviewed  CBC - Abnormal; Notable for the following components:      Result Value   RBC 4.18 (*)    All other components within normal limits  COMPREHENSIVE METABOLIC PANEL - Abnormal; Notable for the following components:   Glucose, Bld 111 (*)    Calcium 8.8 (*)    All other components within normal  limits  RESP PANEL BY RT-PCR (RSV, FLU A&B, COVID)  RVPGX2  URINALYSIS, ROUTINE W REFLEX MICROSCOPIC  TROPONIN I (HIGH SENSITIVITY)    EKG None  Radiology CT Cervical Spine Wo Contrast  Result Date: 03/03/2022 CLINICAL DATA:  Neck trauma. EXAM: CT CERVICAL SPINE WITHOUT CONTRAST TECHNIQUE: Multidetector CT imaging of the cervical spine was performed without intravenous contrast. Multiplanar CT image reconstructions were also generated. RADIATION DOSE REDUCTION: This exam was performed according to the departmental dose-optimization program which includes automated exposure control, adjustment of the mA and/or kV according to patient size and/or use of iterative reconstruction technique. COMPARISON:  None Available. FINDINGS: Alignment: Normal. Skull base and vertebrae: No acute fracture. No primary bone lesion or focal pathologic process. Soft tissues and spinal canal: No prevertebral fluid or swelling. No visible canal hematoma. Disc levels:  Normal Upper chest: Negative. Other: None. IMPRESSION: No evidence of acute traumatic injury to the cervical spine. Electronically Signed   By: Fidela Salisbury M.D.   On: 03/03/2022 20:06   CT Head Wo Contrast  Result Date: 03/03/2022 CLINICAL DATA:  Head trauma. EXAM: CT HEAD WITHOUT CONTRAST TECHNIQUE: Contiguous axial images were obtained from the base of the skull through the vertex without intravenous contrast. RADIATION DOSE REDUCTION: This exam was performed according to the departmental dose-optimization program which includes automated exposure control, adjustment of the mA and/or kV according to patient size and/or use of iterative reconstruction technique. COMPARISON:  None Available. FINDINGS: Brain: No evidence of acute infarction, hemorrhage, hydrocephalus, extra-axial collection or mass lesion/mass effect. Vascular: No hyperdense vessel or unexpected calcification. Skull: Normal. Negative for fracture or focal lesion. Sinuses/Orbits: No acute  finding. Other: None. IMPRESSION: No acute intracranial pathology. Electronically Signed   By: Fidela Salisbury M.D.   On: 03/03/2022 20:03    Procedures Procedures  {Document cardiac monitor, telemetry assessment procedure when appropriate:1}  Medications Ordered in ED Medications - No data to display  ED Course/ Medical Decision Making/ A&P   {   Click here for ABCD2, HEART and other calculatorsREFRESH Note before signing :1}                          Medical Decision Making Amount and/or Complexity of Data Reviewed Labs: ordered.   ***  {Document critical care time when appropriate:1} {Document review of labs and clinical decision tools ie heart score, Chads2Vasc2 etc:1}  {Document your independent review of radiology images, and any outside records:1} {Document your discussion with family members, caretakers, and with consultants:1} {Document social determinants of health affecting pt's care:1} {Document your decision making why or why  not admission, treatments were needed:1} Final Clinical Impression(s) / ED Diagnoses Final diagnoses:  None    Rx / DC Orders ED Discharge Orders     None

## 2022-03-04 LAB — D-DIMER, QUANTITATIVE: D-Dimer, Quant: <0.27 ug/mL-FEU (ref 0.00–0.50)

## 2022-03-04 NOTE — Discharge Instructions (Addendum)
Provided with referral to cardiology for syncope on Saturday.  The office should contact you to schedule this appointment.  Please return to ER for recurrent or worsening symptoms.  Regarding her headache and concussion symptoms, recommend screening rest including phones, TVs, computers.  Provided with referral to concussion clinic, you will need to call and schedule appointment.

## 2022-03-04 NOTE — ED Notes (Signed)
Patient verbalizes understanding of discharge instructions. Opportunity for questioning and answers were provided. Armband removed by staff, pt discharged from ED. Pt ambulatory to ED waiting room with steady gait.  

## 2022-03-12 ENCOUNTER — Ambulatory Visit (INDEPENDENT_AMBULATORY_CARE_PROVIDER_SITE_OTHER): Payer: BC Managed Care – PPO

## 2022-03-12 ENCOUNTER — Ambulatory Visit (INDEPENDENT_AMBULATORY_CARE_PROVIDER_SITE_OTHER): Payer: BC Managed Care – PPO | Admitting: Orthopaedic Surgery

## 2022-03-12 DIAGNOSIS — M25512 Pain in left shoulder: Secondary | ICD-10-CM | POA: Diagnosis not present

## 2022-03-12 MED ORDER — TRIAMCINOLONE ACETONIDE 40 MG/ML IJ SUSP
80.0000 mg | INTRAMUSCULAR | Status: AC | PRN
  Administered 2022-03-12: 80 mg via INTRA_ARTICULAR

## 2022-03-12 MED ORDER — LIDOCAINE HCL 1 % IJ SOLN
4.0000 mL | INTRAMUSCULAR | Status: AC | PRN
  Administered 2022-03-12: 4 mL

## 2022-03-12 NOTE — Progress Notes (Signed)
Chief Complaint: Left shoulder pain.     History of Present Illness:    James Maddox is a 32 y.o. male right-hand-dominant male presents with left shoulder pain after a fall directly on his side.  He states that he had a fall when he passed out and ultimately suffered a concussion.  He does not remember if left shoulder dislocated or subluxated.  Denies any previous history of left shoulder pain.  He has pain probably about the anterior joint line.  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    Surgical History:   none  PMH/PSH/Family History/Social History/Meds/Allergies:   No past medical history on file. Past Surgical History:  Procedure Laterality Date  . KNEE ARTHROSCOPY WITH ANTERIOR CRUCIATE LIGAMENT (ACL) REPAIR Right    Social History   Socioeconomic History  . Marital status: Single    Spouse name: n/a  . Number of children: 1  . Years of education: Not on file  . Highest education level: Not on file  Occupational History  . Occupation: Lexicographer: [employer]    Comment: [employer]  Tobacco Use  . Smoking status: Former    Types: Cigarettes    Quit date: 01/02/2022    Years since quitting: 0.1  . Smokeless tobacco: Never  Substance and Sexual Activity  . Alcohol use: No  . Drug use: No  . Sexual activity: Not on file  Other Topics Concern  . Not on file  Social History Narrative   Lives with his mother.  His daughter lives with her mother.   Social Determinants of Health   Financial Resource Strain: Not on file  Food Insecurity: Not on file  Transportation Needs: Not on file  Physical Activity: Not on file  Stress: Not on file  Social Connections: Not on file   No family history on file. No Known Allergies Current Outpatient Medications  Medication Sig Dispense Refill  . ondansetron (ZOFRAN ODT) 4 MG disintegrating tablet Take 1 tablet (4 mg total) by mouth  every 8 (eight) hours as needed for nausea or vomiting. 20 tablet 0  . pseudoephedrine (SUDAFED) 30 MG tablet Take 1 tablet (30 mg total) by mouth every 4 (four) hours as needed for congestion. 30 tablet 0   No current facility-administered medications for this visit.   No results found.  Review of Systems:   A ROS was performed including pertinent positives and negatives as documented in the HPI.  Physical Exam :   Constitutional: NAD and appears stated age Neurological: Alert and oriented Psych: Appropriate affect and cooperative There were no vitals taken for this visit.   Comprehensive Musculoskeletal Exam:    Musculoskeletal Exam    Inspection Right Left  Skin No atrophy or winging No atrophy or winging  Palpation    Tenderness None Glenohumeral  Range of Motion    Flexion (passive) 170 170  Flexion (active) 170 170  Abduction 170 170  ER at the side 70 70  Can reach behind back to T12 T12  Strength     Full Limited due to pain  Special Tests    Pseudoparalytic No No  Neurologic    Fires PIN, radial, median, ulnar, musculocutaneous, axillary, suprascapular, long thoracic, and spinal accessory innervated muscles. No  abnormal sensibility  Vascular/Lymphatic    Radial Pulse 2+ 2+  Cervical Exam    Patient has symmetric cervical range of motion with negative Spurling's test.  Special Test: Positive apprehension     Imaging:   Xray (3 views left shoulder): Normal   I personally reviewed and interpreted the radiographs.   Assessment:   32 y.o. male right dominant male presents with left shoulder pain consistent with possible labral tear after a fall while he passed out.  I did discuss that overall I do believe that this would likely improve with symptomatic treatment over the course the next month.  I have also offered a steroid injection of the left shoulder to hopefully get him more instant relief.  Will plan to proceed with this today.  He will send me a MyChart  message if still painful in 1 month at that time we will consider an MRI  Plan :    -Plan for left shoulder ultrasound-guided injection of verbal consent obtained    Procedure Note  Patient: James Maddox             Date of Birth: July 03, 1990           MRN: [id number]             Visit Date: 03/12/2022  Procedures: Visit Diagnoses:  1. Acute pain of left shoulder     Large Joint Inj: L glenohumeral on 03/12/2022 10:59 AM Indications: pain Details: 22 G 1.5 in needle, ultrasound-guided anterior approach  Arthrogram: No  Medications: 4 mL lidocaine 1 %; 80 mg triamcinolone acetonide 40 MG/ML Outcome: tolerated well, no immediate complications Procedure, treatment alternatives, risks and benefits explained, specific risks discussed. Consent was given by the patient. Immediately prior to procedure a time out was called to verify the correct patient, procedure, equipment, support staff and site/side marked as required. Patient was prepped and draped in the usual sterile fashion.          I personally saw and evaluated the patient, and participated in the management and treatment plan.  Vanetta Mulders, MD Attending Physician, Orthopedic Surgery  This document was dictated using Dragon voice recognition software. A reasonable attempt at proof reading has been made to minimize errors.

## 2022-03-24 NOTE — Progress Notes (Signed)
  Cardiology Office Note:    Date:  03/24/2022   ID:  James Maddox, DOB August 21, 1990, MRN [id number]  PCP:  Patient, No Pcp Per   [facility] Prov[facility] Cardiologist:  Lenna Sciara, MD Referring MD: Tacy Learn, PA-C   Chief Complaint/Reason for Referral: Syncope and chest pain  PATIENT DID NOT APPEAR FOR APPOINTMENT   ASSESSMENT:    1. Syncope, unspecified syncope type   2. Precordial pain     PLAN:    In order of problems listed above: 1.  Syncope: Will obtain echocardiogram and monitor.  If unrevealing this likely represents a vasovagal mechanism. 2.  Chest pain:              History of Present Illness:    FOCUSED PROBLEM LIST:   1.  No previous medical history  The patient is a 32 y.o. male with the indicated medical history here for ER follow-up for presyncope and occasional chest pain.  Patient was seen in emergency department recently.  Apparently he felt unwell, became diaphoretic, dizzy, and passed out.  He lost consciousness briefly.  While he was not having chest pain at the emergency department he did relate that he has had intermittent chest tightness in October with exertion.  His blood work and EKG were reassuring.  He was ultimately discharged home.          Current Medications: No outpatient medications have been marked as taking for the 04/03/22 encounter (Office Visit) with Early Osmond, MD.     Allergies:    Patient has no known allergies.   Social History:   Social History   Tobacco Use   Smoking status: Former    Types: Cigarettes    Quit date: 01/02/2022    Years since quitting: 0.2   Smokeless tobacco: Never  Substance Use Topics   Alcohol use: No   Drug use: No     Family Hx: No family history on file.   Review of Systems:   Please see the history of present illness.    All other systems reviewed and are negative.     EKGs/Labs/Other Test Reviewed:    EKG:  EKG performed February 2024 that I personally  reviewed demonstrates sinus rhythm with nonspecific ST and T wave changes  Prior CV studies: None available  Other studies Reviewed: Review of the additional studies/records demonstrates: Available imaging reviewed does not demonstrate aortic atherosclerosis or coronary artery calcification  Recent Labs: 03/03/2022: ALT 28; BUN 15; Creatinine, Ser 1.17; Hemoglobin 13.4; Platelets 172; Potassium 3.5; Sodium 135   Recent Lipid Panel No results found for: "CHOL", "TRIG", "HDL", "LDLCALC", "LDLDIRECT"  Risk Assessment/Calculations:      Physical Exam:      Signed, Early Osmond, MD  03/24/2022 2:13 PM    [facility] [address], [place], [place]  [id number] Phone: [phone]; Fax: [phone]   Note:  This document was prepared using Dragon voice recognition software and may include unintentional dictation errors.

## 2022-04-03 ENCOUNTER — Ambulatory Visit (INDEPENDENT_AMBULATORY_CARE_PROVIDER_SITE_OTHER): Payer: BC Managed Care – PPO | Admitting: Internal Medicine

## 2022-04-03 DIAGNOSIS — R072 Precordial pain: Secondary | ICD-10-CM

## 2022-04-03 DIAGNOSIS — R55 Syncope and collapse: Secondary | ICD-10-CM

## 2022-05-01 ENCOUNTER — Ambulatory Visit: Payer: BC Managed Care – PPO | Admitting: Cardiovascular Disease

## 2022-06-11 ENCOUNTER — Ambulatory Visit: Payer: BC Managed Care – PPO | Attending: Internal Medicine | Admitting: Interventional Cardiology

## 2022-06-12 ENCOUNTER — Encounter: Payer: Self-pay | Admitting: Interventional Cardiology

## 2023-03-25 ENCOUNTER — Emergency Department (HOSPITAL_COMMUNITY): Payer: 59

## 2023-03-25 ENCOUNTER — Emergency Department (HOSPITAL_COMMUNITY)
Admission: EM | Admit: 2023-03-25 | Discharge: 2023-03-25 | Disposition: A | Discharge status: Home / Self Care | Payer: 59 | Attending: Emergency Medicine | Admitting: Emergency Medicine

## 2023-03-25 DIAGNOSIS — T148XXA Other injury of unspecified body region, initial encounter: Secondary | ICD-10-CM

## 2023-03-25 DIAGNOSIS — S0083XA Contusion of other part of head, initial encounter: Secondary | ICD-10-CM | POA: Insufficient documentation

## 2023-03-25 DIAGNOSIS — S0990XA Unspecified injury of head, initial encounter: Secondary | ICD-10-CM | POA: Diagnosis present

## 2023-03-25 DIAGNOSIS — Y92410 Unspecified street and highway as the place of occurrence of the external cause: Secondary | ICD-10-CM | POA: Diagnosis not present

## 2023-03-25 MED ORDER — METHOCARBAMOL 500 MG PO TABS
1000.0000 mg | ORAL_TABLET | Freq: Once | ORAL | Status: AC
  Administered 2023-03-25: 1000 mg via ORAL
  Filled 2023-03-25: qty 2

## 2023-03-25 MED ORDER — NAPROXEN 375 MG PO TABS: 375.0000 mg | ORAL_TABLET | Freq: Two times a day (BID) | ORAL | 0 refills | Status: AC

## 2023-03-25 MED ORDER — LIDOCAINE 5 % EX PTCH
1.0000 | MEDICATED_PATCH | CUTANEOUS | Status: DC
  Administered 2023-03-25: 1 via TRANSDERMAL
  Filled 2023-03-25: qty 1

## 2023-03-25 MED ORDER — IBUPROFEN 800 MG PO TABS
800.0000 mg | ORAL_TABLET | Freq: Once | ORAL | Status: AC
  Administered 2023-03-25: 800 mg via ORAL
  Filled 2023-03-25: qty 1

## 2023-03-25 MED ORDER — METHOCARBAMOL 500 MG PO TABS: 500.0000 mg | ORAL_TABLET | Freq: Two times a day (BID) | ORAL | 0 refills | Status: AC
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
## 2023-03-25 NOTE — Discharge Instructions (Addendum)
 Thank you for let us evaluate you today.  Your CT scan was negative for bleeding of your head.  Your left shoulder was unremarkable.  I have sent you home with strong ibuprofen, muscle relaxer for you to take as needed for pain and muscle spasms.  You may start taking these tomorrow as you have have been given a dose here in the emergency department.  Do not take naproxen with Aleve, Advil, aspirin, ibuprofen as these are all in the same family.  You may take Tylenol in addition to prescription medicine if you need for pain.  Please try to avoid strenuous activity.  You may apply heat or ice to areas of pain.  Lidocaine patches can be found over-the-counter at your local pharmacy.
